# Patient Record
Sex: Female | Born: 1990 | Race: White | Hispanic: No | Marital: Single | Smoking: Current some day smoker
Health system: Southern US, Community
[De-identification: ages and names within clinical notes are randomized; demographics above are authoritative.]

## PROBLEM LIST (undated history)

## (undated) DIAGNOSIS — F419 Anxiety disorder, unspecified: Secondary | ICD-10-CM

## (undated) HISTORY — PX: MOLE REMOVAL: SHX2046

---

## 2014-09-28 ENCOUNTER — Encounter (HOSPITAL_BASED_OUTPATIENT_CLINIC_OR_DEPARTMENT_OTHER): Payer: Self-pay | Admitting: *Deleted

## 2014-09-28 ENCOUNTER — Emergency Department (HOSPITAL_BASED_OUTPATIENT_CLINIC_OR_DEPARTMENT_OTHER): Payer: Self-pay

## 2014-09-28 ENCOUNTER — Emergency Department (HOSPITAL_BASED_OUTPATIENT_CLINIC_OR_DEPARTMENT_OTHER)
Admission: EM | Admit: 2014-09-28 | Discharge: 2014-09-29 | Disposition: A | Discharge status: Home / Self Care | Payer: Self-pay | Attending: Emergency Medicine | Admitting: Emergency Medicine

## 2014-09-28 DIAGNOSIS — F419 Anxiety disorder, unspecified: Secondary | ICD-10-CM | POA: Insufficient documentation

## 2014-09-28 DIAGNOSIS — K5732 Diverticulitis of large intestine without perforation or abscess without bleeding: Secondary | ICD-10-CM

## 2014-09-28 DIAGNOSIS — R110 Nausea: Secondary | ICD-10-CM | POA: Insufficient documentation

## 2014-09-28 DIAGNOSIS — Z79899 Other long term (current) drug therapy: Secondary | ICD-10-CM | POA: Insufficient documentation

## 2014-09-28 DIAGNOSIS — K5730 Diverticulosis of large intestine without perforation or abscess without bleeding: Secondary | ICD-10-CM | POA: Insufficient documentation

## 2014-09-28 DIAGNOSIS — R1031 Right lower quadrant pain: Secondary | ICD-10-CM

## 2014-09-28 DIAGNOSIS — Z720 Tobacco use: Secondary | ICD-10-CM | POA: Insufficient documentation

## 2014-09-28 DIAGNOSIS — Z3202 Encounter for pregnancy test, result negative: Secondary | ICD-10-CM | POA: Insufficient documentation

## 2014-09-28 HISTORY — DX: Anxiety disorder, unspecified: F41.9

## 2014-09-28 LAB — CBC WITH DIFFERENTIAL/PLATELET
BASOS ABS: 0 10*3/uL (ref 0.0–0.1)
BASOS PCT: 0 % (ref 0–1)
Eosinophils Absolute: 0 10*3/uL (ref 0.0–0.7)
Eosinophils Relative: 0 % (ref 0–5)
HCT: 35.3 % — ABNORMAL LOW (ref 36.0–46.0)
HEMOGLOBIN: 11.9 g/dL — AB (ref 12.0–15.0)
LYMPHS ABS: 2.4 10*3/uL (ref 0.7–4.0)
Lymphocytes Relative: 22 % (ref 12–46)
MCH: 29 pg (ref 26.0–34.0)
MCHC: 33.7 g/dL (ref 30.0–36.0)
MCV: 85.9 fL (ref 78.0–100.0)
Monocytes Absolute: 0.8 10*3/uL (ref 0.1–1.0)
Monocytes Relative: 7 % (ref 3–12)
NEUTROS PCT: 71 % (ref 43–77)
Neutro Abs: 7.8 10*3/uL — ABNORMAL HIGH (ref 1.7–7.7)
Platelets: 221 10*3/uL (ref 150–400)
RBC: 4.11 MIL/uL (ref 3.87–5.11)
RDW: 12.1 % (ref 11.5–15.5)
WBC: 10.9 10*3/uL — ABNORMAL HIGH (ref 4.0–10.5)

## 2014-09-28 LAB — URINALYSIS, ROUTINE W REFLEX MICROSCOPIC
Bilirubin Urine: NEGATIVE
GLUCOSE, UA: NEGATIVE mg/dL
Hgb urine dipstick: NEGATIVE
KETONES UR: 15 mg/dL — AB
LEUKOCYTES UA: NEGATIVE
NITRITE: NEGATIVE
Protein, ur: NEGATIVE mg/dL
SPECIFIC GRAVITY, URINE: 1.013 (ref 1.005–1.030)
Urobilinogen, UA: 0.2 mg/dL (ref 0.0–1.0)
pH: 6 (ref 5.0–8.0)

## 2014-09-28 LAB — COMPREHENSIVE METABOLIC PANEL
ALK PHOS: 56 U/L (ref 39–117)
ALT: 14 U/L (ref 0–35)
AST: 19 U/L (ref 0–37)
Albumin: 4.5 g/dL (ref 3.5–5.2)
Anion gap: 4 — ABNORMAL LOW (ref 5–15)
BUN: 7 mg/dL (ref 6–23)
CHLORIDE: 105 mmol/L (ref 96–112)
CO2: 25 mmol/L (ref 19–32)
CREATININE: 0.57 mg/dL (ref 0.50–1.10)
Calcium: 8.7 mg/dL (ref 8.4–10.5)
GFR calc Af Amer: >90 mL/min (ref >90)
GFR calc non Af Amer: >90 mL/min (ref >90)
GLUCOSE: 104 mg/dL — AB (ref 70–99)
Potassium: 3.7 mmol/L (ref 3.5–5.1)
Sodium: 134 mmol/L — ABNORMAL LOW (ref 135–145)
Total Bilirubin: 0.8 mg/dL (ref 0.3–1.2)
Total Protein: 7.7 g/dL (ref 6.0–8.3)

## 2014-09-28 LAB — LIPASE, BLOOD: Lipase: 24 U/L (ref 11–59)

## 2014-09-28 LAB — PREGNANCY, URINE: PREG TEST UR: NEGATIVE

## 2014-09-28 MED ORDER — SODIUM CHLORIDE 0.9 % IV BOLUS (SEPSIS)
1000.0000 mL | Freq: Once | INTRAVENOUS | Status: AC
  Administered 2014-09-28: 1000 mL via INTRAVENOUS

## 2014-09-28 MED ORDER — IOHEXOL 300 MG/ML  SOLN
50.0000 mL | Freq: Once | INTRAMUSCULAR | Status: AC | PRN
  Administered 2014-09-28: 50 mL via ORAL

## 2014-09-28 MED ORDER — IOHEXOL 300 MG/ML  SOLN
100.0000 mL | Freq: Once | INTRAMUSCULAR | Status: AC | PRN
  Administered 2014-09-28: 75 mL via INTRAVENOUS

## 2014-09-28 NOTE — ED Notes (Signed)
Pt c/o lower right abd pain with intermittent nausea. Pt sts pain is more prevalent when she walks. Pt denies diarrhea.

## 2014-09-28 NOTE — ED Provider Notes (Signed)
CSN: [id number]     Arrival date & time 09/28/14  1813 History   First MD Initiated Contact with Patient 09/28/14 1932     Chief Complaint  Patient presents with  . Abdominal Pain     (Consider location/radiation/quality/duration/timing/severity/associated sxs/prior Treatment) HPI Helen Butler is a 24 year old female with no past medical history who presents the ER complaining of right lower quadrant abdominal pain. Patient reports intermittent symptoms since last night. Patient states her symptoms are worsened with walking or movement. Patient reports associated nausea without vomiting. Patient denies associated fever, chills, diarrhea, dysuria, vaginal discharge or bleeding. Patient states she went to an urgent care for evaluation of her symptoms, and urgent care sent her to the ER for further evaluation and for rule out appendicitis.  Past Medical History  Diagnosis Date  . Anxiety    Past Surgical History  Procedure Laterality Date  . Mole removal     No family history on file. History  Substance Use Topics  . Smoking status: Current Some Day Smoker  . Smokeless tobacco: Not on file  . Alcohol Use: No   OB History    No data available     Review of Systems  Constitutional: Negative for fever.  HENT: Negative for trouble swallowing.   Eyes: Negative for visual disturbance.  Respiratory: Negative for shortness of breath.   Cardiovascular: Negative for chest pain.  Gastrointestinal: Positive for nausea and abdominal pain. Negative for vomiting.  Genitourinary: Negative for dysuria.  Musculoskeletal: Negative for neck pain.  Skin: Negative for rash.  Neurological: Negative for dizziness, weakness and numbness.  Psychiatric/Behavioral: Negative.       Allergies  Review of patient's allergies indicates no known allergies.  Home Medications   Prior to Admission medications   Medication Sig Start Date End Date Taking? Authorizing Provider  sertraline (ZOLOFT) 100  MG tablet Take 200 mg by mouth daily.   Yes Historical Provider, MD  ciprofloxacin (CIPRO) 500 MG tablet Take 1 tablet (500 mg total) by mouth 2 (two) times daily. One po bid x 10 days 09/29/14   Ladona MowJoe Bilaal Leib, PA-C  metroNIDAZOLE (FLAGYL) 500 MG tablet Take 1 tablet (500 mg total) by mouth 3 (three) times daily. 09/29/14   Ladona MowJoe Koi Yarbro, PA-C  ondansetron (ZOFRAN) 4 MG tablet Take 1 tablet (4 mg total) by mouth every 6 (six) hours. 09/29/14   Ladona MowJoe Husayn Reim, PA-C  oxyCODONE-acetaminophen (PERCOCET) 5-325 MG per tablet Take 1-2 tablets by mouth every 6 (six) hours as needed. 09/29/14   Ladona MowJoe Tsugio Elison, PA-C   BP 105/67 mmHg  Pulse 98  Temp(Src) 98.3 F (36.8 C) (Oral)  Resp 18  Ht 5' (1.524 m)  Wt 95 lb (43.092 kg)  BMI 18.55 kg/m2  SpO2 99%  LMP 09/22/2014 Physical Exam  Constitutional: She is oriented to person, place, and time. She appears well-developed and well-nourished. No distress.  HENT:  Head: Normocephalic and atraumatic.  Mouth/Throat: Oropharynx is clear and moist. No oropharyngeal exudate.  Eyes: Right eye exhibits no discharge. Left eye exhibits no discharge. No scleral icterus.  Neck: Normal range of motion.  Cardiovascular: Normal rate, regular rhythm and normal heart sounds.   No murmur heard. Pulmonary/Chest: Effort normal and breath sounds normal. No respiratory distress.  Abdominal: Soft. There is tenderness in the right lower quadrant. There is tenderness at McBurney's point. There is no rigidity, no guarding, no CVA tenderness and negative Murphy's sign.  Musculoskeletal: Normal range of motion. She exhibits no edema or tenderness.  Neurological: She  is alert and oriented to person, place, and time. No cranial nerve deficit. Coordination normal.  Skin: Skin is warm and dry. No rash noted. She is not diaphoretic.  Psychiatric: She has a normal mood and affect.  Nursing note and vitals reviewed.   ED Course  Procedures (including critical care time) Labs Review Labs Reviewed   URINALYSIS, ROUTINE W REFLEX MICROSCOPIC - Abnormal; Notable for the following:    Ketones, ur 15 (*)    All other components within normal limits  CBC WITH DIFFERENTIAL/PLATELET - Abnormal; Notable for the following:    WBC 10.9 (*)    Hemoglobin 11.9 (*)    HCT 35.3 (*)    Neutro Abs 7.8 (*)    All other components within normal limits  COMPREHENSIVE METABOLIC PANEL - Abnormal; Notable for the following:    Sodium 134 (*)    Glucose, Bld 104 (*)    Anion gap 4 (*)    All other components within normal limits  PREGNANCY, URINE  LIPASE, BLOOD    Imaging Review Ct Abdomen Pelvis W Contrast  09/28/2014   CLINICAL DATA:  RIGHT lower quadrant pain, nausea. Pain worse while walking. Intermittent nausea. Initial evaluation.  EXAM: CT ABDOMEN AND PELVIS WITH CONTRAST  TECHNIQUE: Multidetector CT imaging of the abdomen and pelvis was performed using the standard protocol following bolus administration of intravenous contrast.  CONTRAST:  50mL OMNIPAQUE IOHEXOL 300 MG/ML SOLN, 75mL OMNIPAQUE IOHEXOL 300 MG/ML SOLN  COMPARISON:  None.  FINDINGS: LUNG BASES: Included view of the lung bases are clear. Visualized heart and pericardium are unremarkable.  SOLID ORGANS: The liver, spleen, gallbladder, pancreas and adrenal glands are unremarkable.  GASTROINTESTINAL TRACT: Focal ascending colonic wall thickening and inflammatory changes, coronal 25/68, axial 43/86. Ascending colonic diverticulosis. The stomach, small bowel are normal in course and caliber without inflammatory changes. Normal appendix.  KIDNEYS/ URINARY TRACT: Kidneys are orthotopic, demonstrating symmetric enhancement. No nephrolithiasis, hydronephrosis or solid renal masses. The unopacified ureters are normal in course and caliber. Urinary bladder is partially distended and unremarkable.  PERITONEUM/RETROPERITONEUM: Aortoiliac vessels are normal in course and caliber. No lymphadenopathy by CT size criteria. Internal reproductive organs are  unremarkable. Small amount of free fluid in the pelvis is likely physiologic.  SOFT TISSUE/OSSEOUS STRUCTURES: Non-suspicious.  IMPRESSION: Acute ascending colonic inflammation and diverticulosis, likely reflecting acute diverticulitis though, this could reflect inflammatory bowel disease, less favored. No bowel obstruction.  Normal appendix.   Electronically Signed   By: Awilda Metro   On: 09/28/2014 23:54     EKG Interpretation None      MDM   Final diagnoses:  RLQ abdominal pain  Diverticulitis of colon    Patient here with right lower quadrant abdominal pain, sent from urgent care for rule out appendicitis. Will follow-up with CT abdomen pelvis. Patient's pain under control, patient in no acute distress. After asking if patient needs pain control, patient denies, stating she is comfortable. Pt's labs unremarkable for acute pathology.  No leukocytosis or anemia.  Electrolytes WNL.  Renal function intact.  UA unremarkable for acute pathology.  No concern for ectopic pregnancy or PID.   CT abd/pelv with impression: Acute ascending colonic inflammation and diverticulosis, likely reflecting acute diverticulitis though, this could reflect inflammatory bowel disease, less favored. No bowel obstruction.  Normal appendix.  These findings correlate closely with pt's s/s and exam.  No further exam warranted based on these objective CT findings-- pt's pain confirmed as abdominal in nature and not pelvic.  Patient  is nontoxic, nonseptic appearing, in no apparent distress.  Patient's pain and other symptoms adequately managed in emergency department.  Fluid bolus given.  Labs, imaging and vitals reviewed.  Patient does not meet the SIRS or Sepsis criteria.  On repeat exam patient does not have a surgical abdomin and there are no peritoneal signs. Pt has mild discomfort and is hemodynamically stable.  Pt stable for d/c and tx for diverticulitis as out pt.  Patient discharged home with symptomatic  treatment and given strict instructions for follow-up with her gastroenterologist at [facility].  I have also discussed reasons to return immediately to the ER.  Patient expresses understanding and agrees with plan. I encouraged pt to call or return to the ER should she have any questions or concerns.    BP 105/67 mmHg  Pulse 98  Temp(Src) 98.3 F (36.8 C) (Oral)  Resp 18  Ht 5' (1.524 m)  Wt 95 lb (43.092 kg)  BMI 18.55 kg/m2  SpO2 99%  LMP 09/22/2014  Signed,  Ladona Mow, PA-C 1:49 PM  Pt discussed with Dr. Read Drivers, MD     Ladona Mow, PA-C 09/29/14 1349  Paula Libra, MD 09/29/14 2258

## 2014-09-29 MED ORDER — ONDANSETRON HCL 4 MG PO TABS: 4.0000 mg | ORAL_TABLET | Freq: Four times a day (QID) | ORAL | Status: AC

## 2014-09-29 MED ORDER — CIPROFLOXACIN HCL 500 MG PO TABS
500.0000 mg | ORAL_TABLET | Freq: Once | ORAL | Status: AC
  Administered 2014-09-29: 500 mg via ORAL
  Filled 2014-09-29: qty 1

## 2014-09-29 MED ORDER — METRONIDAZOLE 500 MG PO TABS: 500.0000 mg | ORAL_TABLET | Freq: Three times a day (TID) | ORAL | Status: AC

## 2014-09-29 MED ORDER — OXYCODONE-ACETAMINOPHEN 5-325 MG PO TABS: 1.0000 | ORAL_TABLET | Freq: Four times a day (QID) | ORAL | Status: AC | PRN

## 2014-09-29 MED ORDER — CIPROFLOXACIN HCL 500 MG PO TABS: 500.0000 mg | ORAL_TABLET | Freq: Two times a day (BID) | ORAL | Status: AC

## 2014-09-29 MED ORDER — METRONIDAZOLE 500 MG PO TABS
500.0000 mg | ORAL_TABLET | Freq: Once | ORAL | Status: AC
  Administered 2014-09-29: 500 mg via ORAL
  Filled 2014-09-29: qty 1

## 2014-09-29 NOTE — Discharge Instructions (Signed)
Follow-up with your gastroenterologist. Return to the ER with any severe abdominal pain, severe nausea or vomiting, inability to keep food or fluids down, high fever greater 100.76F.   Diverticulitis Diverticulitis is inflammation or infection of small pouches in your colon that form when you have a condition called diverticulosis. The pouches in your colon are called diverticula. Your colon, or large intestine, is where water is absorbed and stool is formed. Complications of diverticulitis can include:  Bleeding.  Severe infection.  Severe pain.  Perforation of your colon.  Obstruction of your colon. CAUSES  Diverticulitis is caused by bacteria. Diverticulitis happens when stool becomes trapped in diverticula. This allows bacteria to grow in the diverticula, which can lead to inflammation and infection. RISK FACTORS People with diverticulosis are at risk for diverticulitis. Eating a diet that does not include enough fiber from fruits and vegetables may make diverticulitis more likely to develop. SYMPTOMS  Symptoms of diverticulitis may include:  Abdominal pain and tenderness. The pain is normally located on the left side of the abdomen, but may occur in other areas.  Fever and chills.  Bloating.  Cramping.  Nausea.  Vomiting.  Constipation.  Diarrhea.  Blood in your stool. DIAGNOSIS  Your health care provider will ask you about your medical history and do a physical exam. You may need to have tests done because many medical conditions can cause the same symptoms as diverticulitis. Tests may include:  Blood tests.  Urine tests.  Imaging tests of the abdomen, including X-rays and CT scans. When your condition is under control, your health care provider may recommend that you have a colonoscopy. A colonoscopy can show how severe your diverticula are and whether something else is causing your symptoms. TREATMENT  Most cases of diverticulitis are mild and can be treated  at home. Treatment may include:  Taking over-the-counter pain medicines.  Following a clear liquid diet.  Taking antibiotic medicines by mouth for 7-10 days. More severe cases may be treated at a hospital. Treatment may include:  Not eating or drinking.  Taking prescription pain medicine.  Receiving antibiotic medicines through an IV tube.  Receiving fluids and nutrition through an IV tube.  Surgery. HOME CARE INSTRUCTIONS   Follow your health care provider's instructions carefully.  Follow a full liquid diet or other diet as directed by your health care provider. After your symptoms improve, your health care provider may tell you to change your diet. He or she may recommend you eat a high-fiber diet. Fruits and vegetables are good sources of fiber. Fiber makes it easier to pass stool.  Take fiber supplements or probiotics as directed by your health care provider.  Only take medicines as directed by your health care provider.  Keep all your follow-up appointments. SEEK MEDICAL CARE IF:   Your pain does not improve.  You have a hard time eating food.  Your bowel movements do not return to normal. SEEK IMMEDIATE MEDICAL CARE IF:   Your pain becomes worse.  Your symptoms do not get better.  Your symptoms suddenly get worse.  You have a fever.  You have repeated vomiting.  You have bloody or black, tarry stools. MAKE SURE YOU:   Understand these instructions.  Will watch your condition.  Will get help right away if you are not doing well or get worse. Document Released: 04/19/2005 Document Revised: 07/15/2013 Document Reviewed: 06/04/2013 [facility] Patient Information 2015 [place], [place]. This information is not intended to replace advice given to you by your  health care provider. Make sure you discuss any questions you have with your health care provider.   Emergency Department Resource Guide 1) Find a Doctor and Pay Out of Pocket Although you won't have to  find out who is covered by your insurance plan, it is a good idea to ask around and get recommendations. You will then need to call the office and see if the doctor you have chosen will accept you as a new patient and what types of options they offer for patients who are self-pay. Some doctors offer discounts or will set up payment plans for their patients who do not have insurance, but you will need to ask so you aren't surprised when you get to your appointment.  2) Contact Your Local Health Department Not all health departments have doctors that can see patients for sick visits, but many do, so it is worth a call to see if yours does. If you don't know where your local health department is, you can check in your phone book. The CDC also has a tool to help you locate your state's health department, and many state websites also have listings of all of their local health departments.  3) Find a Walk-in Clinic If your illness is not likely to be very severe or complicated, you may want to try a walk in clinic. These are popping up all over the country in pharmacies, drugstores, and shopping centers. They're usually staffed by nurse practitioners or physician assistants that have been trained to treat common illnesses and complaints. They're usually fairly quick and inexpensive. However, if you have serious medical issues or chronic medical problems, these are probably not your best option.  No Primary Care Doctor: - Call Health Connect at  [phone] - they can help you locate a primary care doctor that  accepts your insurance, provides certain services, etc. - Physician Referral Service- [phone]  Chronic Pain Problems: Organization         Address  Phone   Notes  [facility]  [phone] Patients need to be referred by their primary care doctor.   Medication Assistance: Organization         Address  Phone   Notes  [facility] Medication [facility] [address] [place], [place] [address] [phone] --Must be a resident of [facility] -- Must have NO insurance coverage whatsoever (no Medicaid/ Medicare, etc.) -- The pt. MUST have a primary care doctor that directs their care regularly and follows them in the community   [facility]ist  [phone]   [employer]  [phone]    Agencies that provide inexpensive medical care: Organization         Address  Phone   Notes  [facility]  [phone]   [facility]    [phone]   [facility] [address] [place], [place] [address] [phone]   [facility] [address] [place] [place] [phone]   Planned Parenthood    [phone]   [facility]    [phone]   [facility]  [address], [place] Phone:  [phone], Fax:  [phone] Hours of Operation:  9 am - 6 pm, M-F.  Also accepts Medicaid/Medicare and self-pay.  [facility]  [address], [place] Phone: [phone], Fax: [phone]. Hours of  Operation:  8:30 am - 5:30 pm, M-F.  Also accepts Medicaid and self-pay.  [facility] [place] [address], [place] Phone: [phone](336) [phone]   [facility] [address], [place], [place] [phone], Ext. [phone] Mondays & Thursdays: 7-9 AM.  First 15 patients are seen on a first come, first serve basis.    Medicaid-accepting [facility] Providers:  Organization         Address  Phone   Notes  [facility] [address], [place] [phone]) [phone] Also accepts self-pay patients.  [facility] [address], [place]  [phone]) [phone]   [facility] [address], [place] [phone](336) [phone]   [facility] [address] [place] [address], [place] [phone]   Renaye RakersVeita  Bland [address], [place]   [phone] Only accepts [place] Access [place] patients after they have their name applied to their card.   Self-Pay (no insurance) in [facility]:  Organization         Address  Phone   Notes  Sickle Cell Patients, [facility] [address], [place] [phone]   [facility] [address], [place] [phone]   [facility] [place]  [address], Loop [phone]   [facility]/Dr. Osei-Bonsu  [address] [place] [address], [place] or [phone], [place] [phone]) [phone] Phone number for both [place] and [place] locations is the same.  [facility] [address], [place] [phone]) [phone]   [facility] [address] [place] [address], [place] or [address] [phone]) [phone]   [facility] [address], [place] [phone](336) [phone], phone; [phone](336) [phone], fax Sees patients 1st and 3rd Saturday of every month.  Must not qualify for public or private insurance (i.e. Medicaid, Medicare, [place] Health Choice, Veterans' Benefits)  Household income should be no more than 200% of the poverty level The clinic cannot treat you if you are pregnant or think you are pregnant  Sexually transmitted diseases are not treated at the clinic.    Dental Care: Organization         Address  Phone  Notes  [facility] of [facility] [address], [place] [phone] Accepts children up to age 26 who are enrolled in [place] or [place] Health Choice; pregnant women with a Medicaid card; and children who have applied for Medicaid or [place] Health Choice, but were declined, whose parents can pay a reduced fee at time of service.  [facility] of [facility] [place]  [address], [place] [phone]) [phone] Accepts children up to age  26 who are enrolled in [place] or [place] Health Choice; pregnant women with a Medicaid card; and children who have applied for Medicaid or  Health Choice, but were declined, whose parents can pay a reduced fee at time of service.  [facility]  [address], [place] [phone]) [phone] Patients are seen by appointment only. Walk-ins are not accepted. [facility] will see patients 26 years of age and older. Monday - Tuesday (8am-5pm) Most Wednesdays (8:30-5pm) $30 per visit, cash only  [facility]  [address], [facility] [phone]) [phone] Patients are seen  by appointment only. Walk-ins are not accepted. [facility] will see patients 37 years of age and older. One Wednesday Evening (Monthly: Volunteer Based).  $30 per visit, cash only  [employer]  [phone] for adults; Children under age 57, call Graduate Pediatric Dentistry at [phone]. Children aged 90-14, please call [phone] to request a pediatric application.  Dental services are provided in all areas of dental care including fillings, crowns and bridges, complete and partial dentures, implants, gum treatment, root canals, and extractions. Preventive care is also provided. Treatment is provided to both adults and children. Patients are selected via a lottery and there is often a waiting list.   [facility] [address] [place]  [phone] [email or web address]   [facility] [address] [place], [place] [phone], Ext. [phone] Second and Fourth Thursday of each month, opens at 6:30 AM; Clinic ends at 9 AM.  Patients are seen on a first-come first-served basis, and a limited number are seen during each clinic.   [facility]  [address] [place], [place] [phone]   Eligibility Requirements You must have lived in [place], [place], or [place] counties for at least the last three  months.   You cannot be eligible for state or federal sponsored [place], including [employer], [place], or [employer].   You generally cannot be eligible for healthcare insurance through your employer.    How to apply: Eligibility screenings are held every Tuesday and Wednesday afternoon from 1:00 pm until 4:00 pm. You do not need an appointment for the interview!  [facility] [address] [place], [place] [phone]   [facility] Health Department  [phone]   [facility] Health Department  [phone]   [facility] Health Department  [phone]    Behavioral Health Resources in the Community: Intensive Outpatient Programs Organization         Address  Phone  Notes  [facility] [address] [place], [place] [phone]   [facility] Outpatient [address], [place], [place] [phone]   [facility] [address] [place], [place]  [phone]   [facility] [address]  [place], [place] [phone] or [phone]   Substance Abuse Resources Organization         Address  Phone  Notes  [facility]  [phone]   [facility]  [phone]   [facility]  [phone]   Floydene Flock  ([phone]   Residential & Outpatient Substance Abuse Program  [phone]   Psychological Services Organization         Address  Phone  Notes  [facility] Behavioral Health  [phone]   [facility] Services  [phone]   [facility] [address] [place] [phone] or [phone]    Mobile Crisis Teams Organization         Address  Phone  Notes  [facility], Mobile Crisis Care Unit  [phone]   [facility]  [address] [place], [place] [phone]   Doristine Locks [address] [place] [phone]    Self-Help/Support  Groups Organization         Address  Phone             Notes  [facility] [place] - variety of support groups  336- I7437963 Call for more  information  Narcotics Anonymous (NA), [facility] [address], [place]  2 meetings at this location   Residential Sports administratorTreatment Programs Organization         Address  Phone  Notes  [facility] [address],    [place]  [phone]   [facility]  [address], [place] [phone], [place], [place] [phone]   [facility] [address], [place] [phone] Admissions: 8am-3pm M-F  [facility] [address]    [place], [place] [phone]   [facility] [address], [place], [place] [phone]   [facility] [address]  [place], [place] [phone]   [facility] - Intensive Outpatient [address], [place], [place] [phone]   [facility] ([facility] [address]  [place], [place] [phone] or [phone]   [facility] ([facility]) [address] [place], [place] [phone] Accepts Medicaid  [facility] [address]  [place] [phone] Substance Abuse/Addiction Treatment   [facility] Behavioral Health Resources Organization         Address  Phone  Notes  [facility]  [phone](888) [phone]   Angie FavaJulie Brannon, PhD [address], [address] [place], [place]   [phone] or [phone](336) [phone]   [facility]   [address] [place], [place] [phone]) [phone]   [facility] [address], [place], [place] [phone]) [phone] Insurance/Medicaid/sponsorship through [facility]  [facility] [address]                                    [place], [place] [phone]) [phone] Therapy/tele-psych/case  [facility] [address]   [place], [place] [phone](336) [phone]    Dr. Lolly MustacheArfeen  [phone](336) [phone]   [facility] [place]  [employer] [facility] 1) [address], [place] 2) [address], [place] 3)  [address], [place] [phone]) [phone]  [phone]) [phone]   [facility] Child Abuse Hotline [phone]) [phone] or [phone](336) [phone] (After Hours)

## 2016-09-08 IMAGING — CT CT ABD-PELV W/ CM
2 of 4 series · 16 of 46 positions shown, 18 images · IV contrast (APPLIED)
Comparison: None.

CLINICAL DATA: RIGHT lower quadrant pain, nausea. Pain worse while
walking. Intermittent nausea. Initial evaluation.

EXAM:
CT ABDOMEN AND PELVIS WITH CONTRAST
TECHNIQUE: Multidetector CT imaging of the abdomen and pelvis was performed
using the standard protocol following bolus administration of
intravenous contrast.
CONTRAST:  50mL OMNIPAQUE IOHEXOL 300 MG/ML SOLN, 75mL OMNIPAQUE
IOHEXOL 300 MG/ML SOLN

[Series 2: abd/pelvis 5.0 b31f · axial · 0.63mm/px · z∈[-323,+67]mm · 13 of 86 slices shown, 15 images]
[im 4/86  soft-tissue]
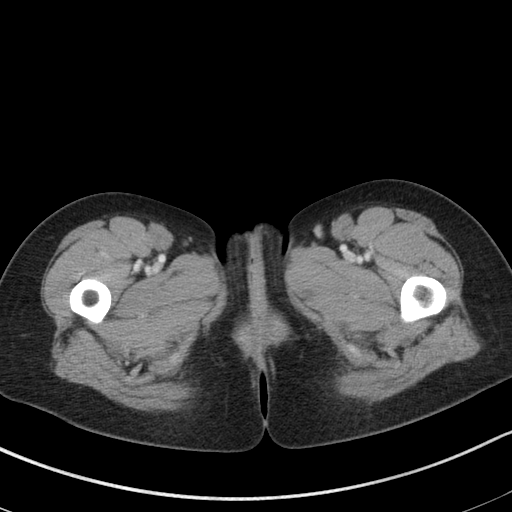
[im 4/86  bone]
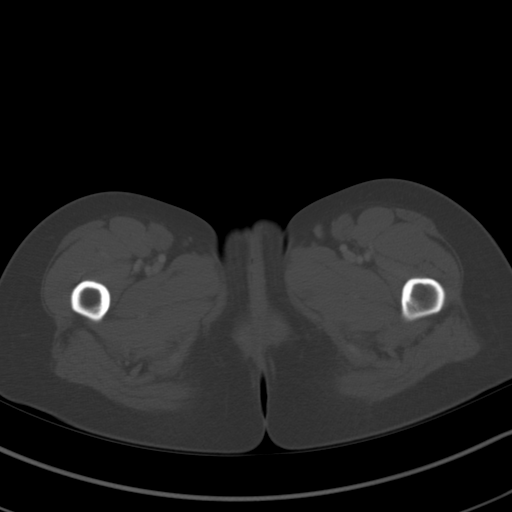
[im 11/86  soft-tissue]
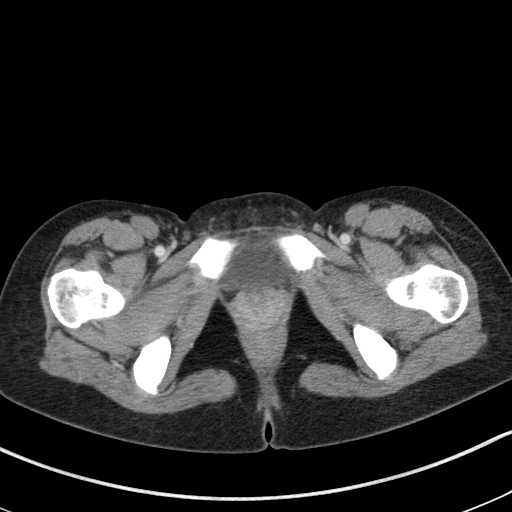
[im 18/86  soft-tissue]
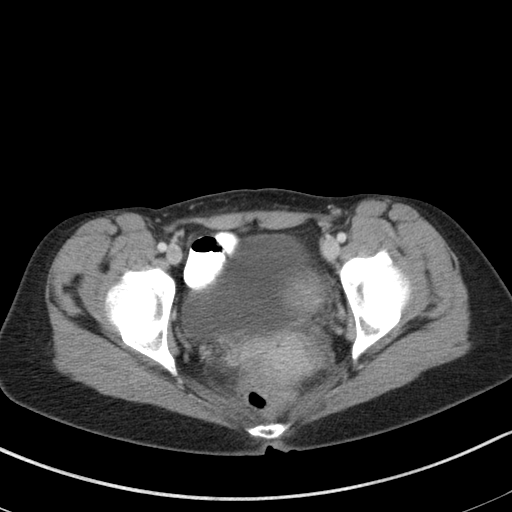
[im 25/86  soft-tissue]
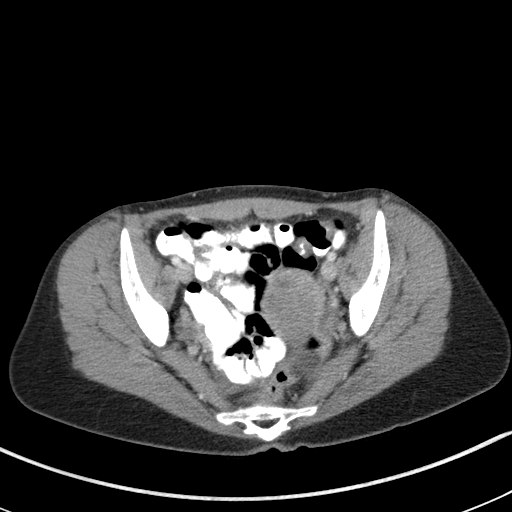
[im 29/86  soft-tissue]
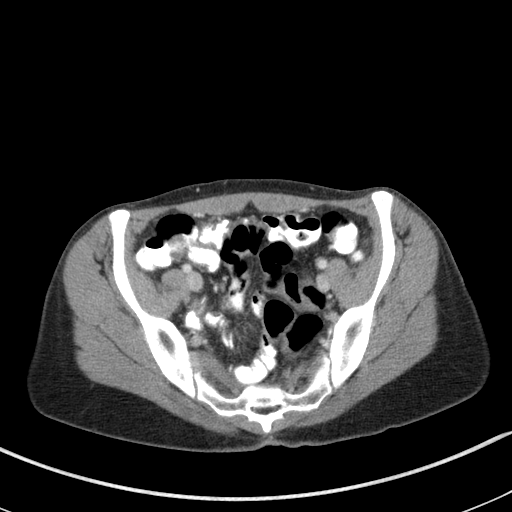
[im 36/86  soft-tissue]
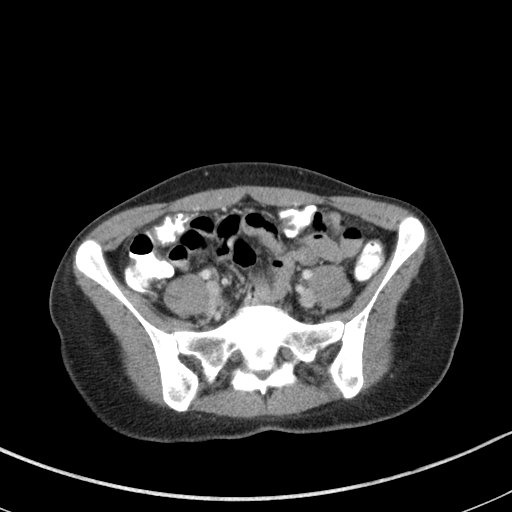
[im 43/86  soft-tissue]
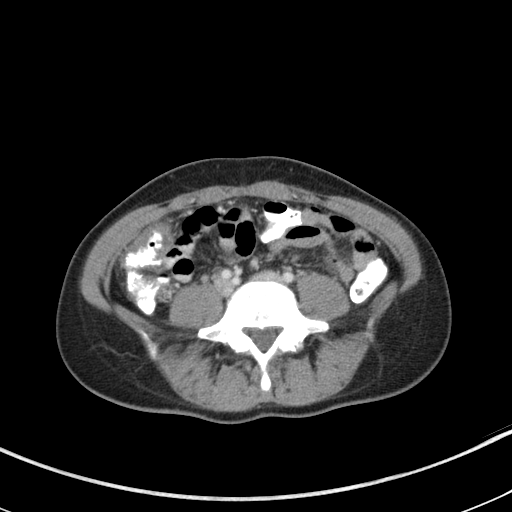
[im 50/86  soft-tissue]
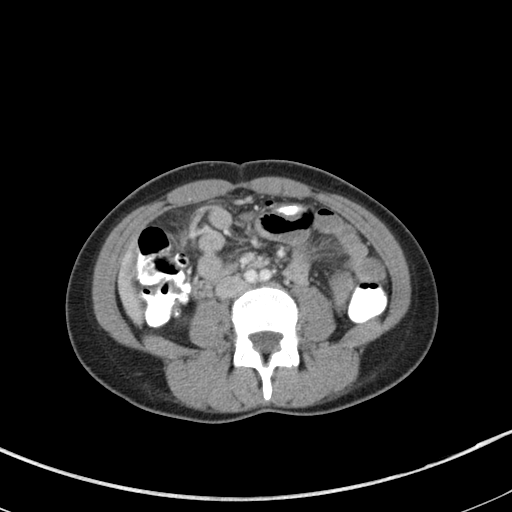
[im 57/86  soft-tissue]
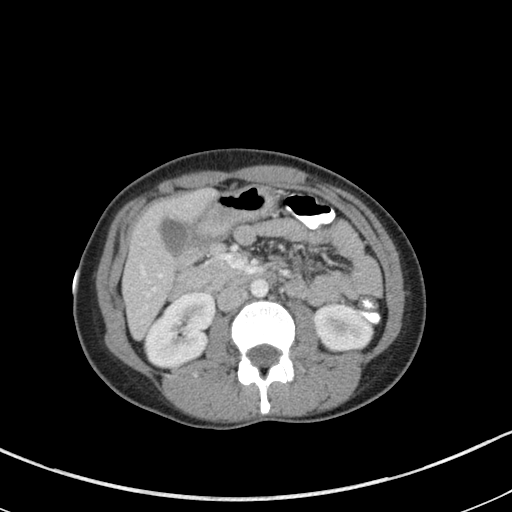
[im 57/86  bone]
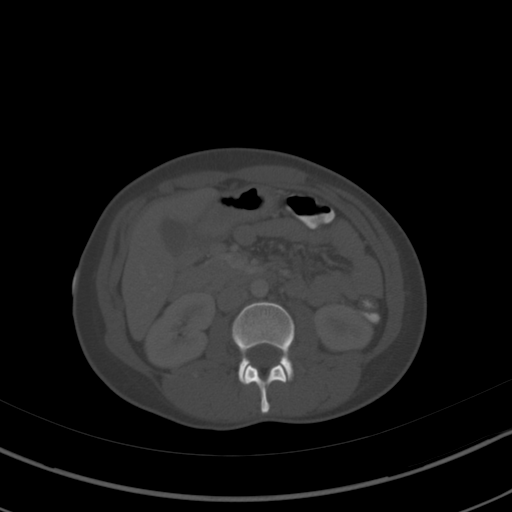
[im 61/86  soft-tissue]
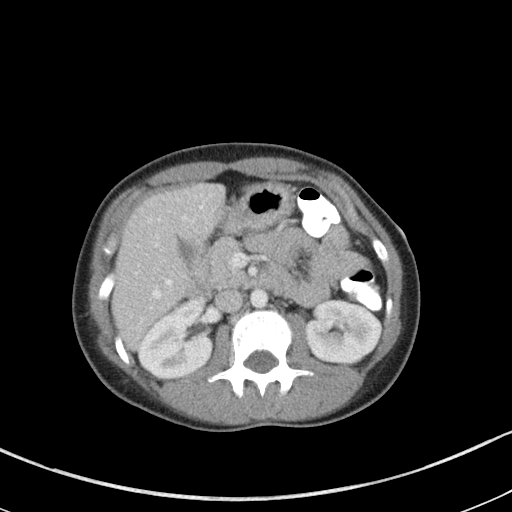
[im 68/86  soft-tissue]
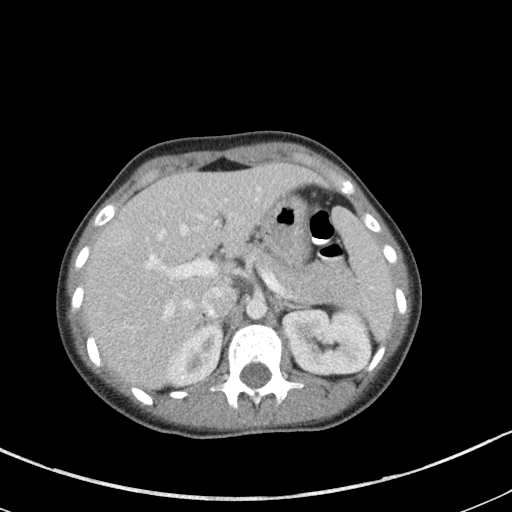
[im 75/86  soft-tissue]
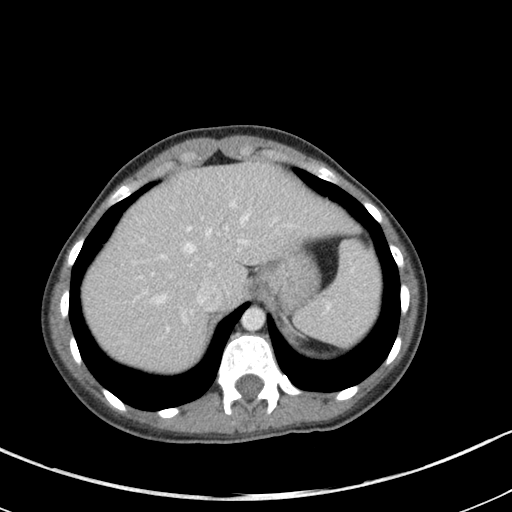
[im 82/86  soft-tissue]
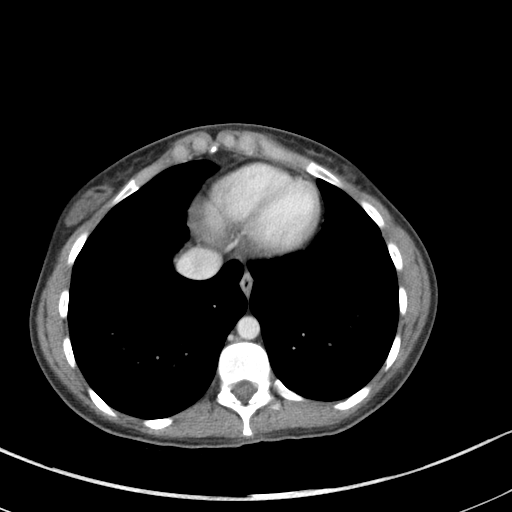

[Series 5: abd/pelvis 3.0 coronal · coronal · 0.66mm/px · 3 of 68 slices shown]
[im 23/68  soft-tissue]
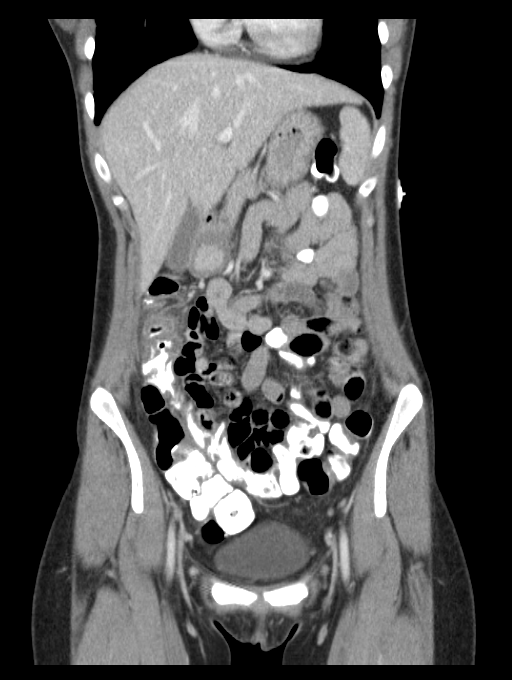
[im 30/68  soft-tissue]
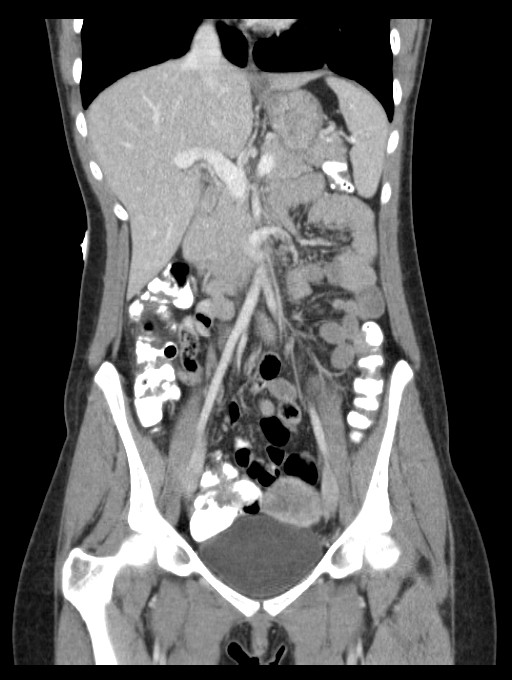
[im 38/68  soft-tissue]
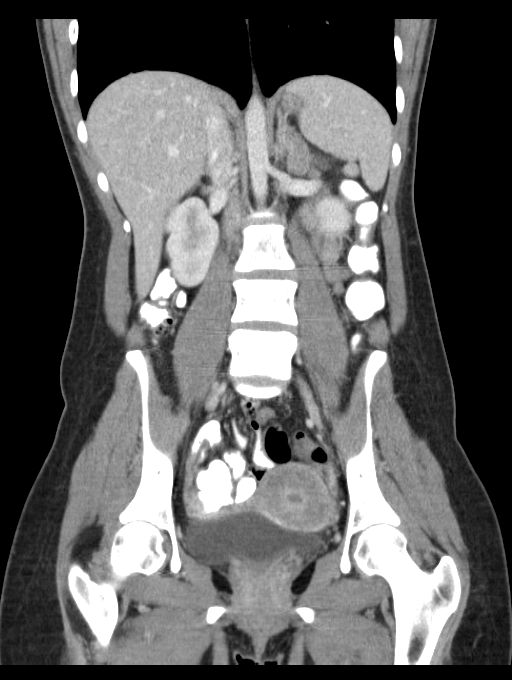

[16 of 46 positions shown; findings below may reference images not displayed]

FINDINGS: LUNG BASES: Included view of the lung bases are clear. Visualized
heart and pericardium are unremarkable.

SOLID ORGANS: The liver, spleen, gallbladder, pancreas and adrenal
glands are unremarkable.

GASTROINTESTINAL TRACT: Focal ascending colonic wall thickening and
inflammatory changes, coronal 25/68, axial 43/86. Ascending colonic
diverticulosis. The stomach, small bowel are normal in course and
caliber without inflammatory changes. Normal appendix.

KIDNEYS/ URINARY TRACT: Kidneys are orthotopic, demonstrating
symmetric enhancement. No nephrolithiasis, hydronephrosis or solid
renal masses. The unopacified ureters are normal in course and
caliber. Urinary bladder is partially distended and unremarkable.

PERITONEUM/RETROPERITONEUM: Aortoiliac vessels are normal in course
and caliber. No lymphadenopathy by CT size criteria. Internal
reproductive organs are unremarkable. Small amount of free fluid in
the pelvis is likely physiologic.

SOFT TISSUE/OSSEOUS STRUCTURES: Non-suspicious.
IMPRESSION: Acute ascending colonic inflammation and diverticulosis, likely
reflecting acute diverticulitis though, this could reflect
inflammatory bowel disease, less favored. No bowel obstruction.

Normal appendix.

  By: Bambucafe Tarla
